# Patient Record
Sex: Male | Born: 2000 | Race: Black or African American | Hispanic: No | Marital: Single | Smoking: Never smoker
Health system: Southern US, Community
[De-identification: ages and names within clinical notes are randomized; demographics above are authoritative.]

---
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
## 2020-02-05 ENCOUNTER — Other Ambulatory Visit: Payer: Self-pay

## 2020-02-05 ENCOUNTER — Ambulatory Visit (HOSPITAL_COMMUNITY)
Admission: EM | Admit: 2020-02-05 | Discharge: 2020-02-05 | Disposition: A | Discharge status: Home / Self Care | Payer: Medicaid Other | Attending: Internal Medicine | Admitting: Internal Medicine

## 2020-02-05 DIAGNOSIS — Z1152 Encounter for screening for COVID-19: Secondary | ICD-10-CM | POA: Diagnosis not present

## 2020-02-05 DIAGNOSIS — Z20822 Contact with and (suspected) exposure to covid-19: Secondary | ICD-10-CM | POA: Insufficient documentation

## 2020-02-05 LAB — SARS CORONAVIRUS 2 (TAT 6-24 HRS): SARS Coronavirus 2: NEGATIVE

## 2020-02-05 NOTE — ED Triage Notes (Signed)
Pt presents for covid testing with no known symptoms. 

## 2020-02-05 NOTE — Discharge Instructions (Signed)
If your Covid-19 test is positive, you will get a phone call from [facility] regarding your results. If your Covid-19 test is negative, you will NOT get a phone call from [facility] with your results. You may view your results on MyChart. If you do not have a MyChart account, sign up instructions are in your discharge papers. ° °

## 2022-02-25 ENCOUNTER — Encounter (HOSPITAL_COMMUNITY): Payer: Self-pay

## 2022-02-25 ENCOUNTER — Ambulatory Visit (HOSPITAL_COMMUNITY): Payer: Self-pay

## 2022-02-25 ENCOUNTER — Ambulatory Visit (HOSPITAL_COMMUNITY)
Admission: EM | Admit: 2022-02-25 | Discharge: 2022-02-25 | Disposition: A | Discharge status: Home / Self Care | Payer: Medicaid Other | Attending: Family Medicine | Admitting: Family Medicine

## 2022-02-25 DIAGNOSIS — Z202 Contact with and (suspected) exposure to infections with a predominantly sexual mode of transmission: Secondary | ICD-10-CM

## 2022-02-25 DIAGNOSIS — Z113 Encounter for screening for infections with a predominantly sexual mode of transmission: Secondary | ICD-10-CM

## 2022-02-25 NOTE — ED Triage Notes (Signed)
Pt states he wants to be tested for STD denies any symptoms. 

## 2022-02-25 NOTE — Discharge Instructions (Addendum)
We will notify you of any positives on your swab

## 2022-02-25 NOTE — ED Provider Notes (Addendum)
[facility]    CSN: [id number] Arrival date & time: 02/25/22  1407      History   Chief Complaint Chief Complaint  Patient presents with   std testing    HPI Russell Russo is a 21 y.o. male.   HPI Here for STD testing. He has no penile discharge, itching, or dysuria.  He also has no fever or vomiting or abdominal pain.  He is not allergic to any medicines  He states he had HIV and RPR testing in July.  He has used barrier protection but not consistently in the past.  History reviewed. No pertinent past medical history.  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 No pertinent surgical history.     Home Medications    Prior to Admission medications   Medication Sig Start Date End Date Taking? Authorizing Provider  diphenhydrAMINE (BENADRYL) 25 mg capsule Take 1 capsule (25 mg total) by mouth every 6 (six) hours as needed for itching or allergies. 01/22/17 06/27/19  Blane Ohara, MD    Family History Family History  Problem Relation Age of Onset   Healthy Mother    Healthy Father     Social History Social History   Tobacco Use   Smoking status: Passive Smoke Exposure - Never Smoker   Smokeless tobacco: Never  Substance Use Topics   Alcohol use: Never   Drug use: Never     Allergies   Patient has no known allergies.   Review of Systems Review of Systems   Physical Exam Triage Vital Signs ED Triage Vitals [02/25/22 1438]  Enc Vitals Group     BP 108/72     Pulse Rate 74     Resp 16     Temp 98.6 F (37 C)     Temp Source Oral     SpO2 98 %     Weight      Height      Head Circumference      Peak Flow      Pain Score 0     Pain Loc      Pain Edu?      Excl. in [place]?    No data found.  Updated Vital Signs BP 108/72 (BP  Location: Right Arm)   Pulse 74   Temp 98.6 F (37 C) (Oral)   Resp 16   SpO2 98%   Visual Acuity Right Eye Distance:   Left Eye Distance:   Bilateral Distance:    Right Eye Near:   Left Eye Near:    Bilateral Near:     Physical Exam Vitals reviewed.  Constitutional:      General: He is not in acute distress.    Appearance: He is not ill-appearing, toxic-appearing or diaphoretic.  Cardiovascular:     Rate and Rhythm: Normal rate and regular rhythm.  Pulmonary:     Effort: Pulmonary effort is normal.     Breath sounds: Normal breath sounds.  Neurological:     Mental Status: He is alert and oriented to person, place, and time.  Psychiatric:        Behavior: Behavior normal.      UC Treatments / Results  Labs (all labs ordered are listed, but only abnormal results are displayed) Labs Reviewed  CYTOLOGY, (ORAL, ANAL, URETHRAL) ANCILLARY  ONLY    EKG   Radiology No results found.  Procedures Procedures (including critical care time)  Medications Ordered in UC Medications - No data to display  Initial Impression / Assessment and Plan / UC Course  I have reviewed the triage vital signs and the nursing notes.  Pertinent labs & imaging results that were available during my care of the patient were reviewed by me and considered in my medical decision making (see chart for details).        Self swab is done, and we will notify him of any positives and treat per protocol.  He is given education on safe sex practices. Final Clinical Impressions(s) / UC Diagnoses   Final diagnoses:  Screening for STD (sexually transmitted disease)     Discharge Instructions      We will notify you of any positives on your swab     ED Prescriptions   None    PDMP not reviewed this encounter.   Zenia Resides, MD 02/25/22 1514    Zenia Resides, MD 02/25/22 [phone]

## 2022-03-01 ENCOUNTER — Telehealth (HOSPITAL_COMMUNITY): Payer: Self-pay | Admitting: Emergency Medicine

## 2022-03-01 ENCOUNTER — Ambulatory Visit (HOSPITAL_COMMUNITY)
Admission: EM | Admit: 2022-03-01 | Discharge: 2022-03-01 | Disposition: A | Discharge status: Home / Self Care | Payer: Medicaid Other | Attending: Internal Medicine | Admitting: Internal Medicine

## 2022-03-01 DIAGNOSIS — A749 Chlamydial infection, unspecified: Secondary | ICD-10-CM | POA: Insufficient documentation

## 2022-03-01 NOTE — Telephone Encounter (Signed)
Patient will need to return for recollect of cyto swab, lab states foil was pierced.   Attempted to reach patient x 1, LVM

## 2022-03-01 NOTE — ED Triage Notes (Signed)
Pt here for recollect

## 2022-03-01 NOTE — Telephone Encounter (Signed)
Patient returned call, reviewed, he states he will return today

## 2022-03-02 LAB — CYTOLOGY, (ORAL, ANAL, URETHRAL) ANCILLARY ONLY
Chlamydia: POSITIVE — AB
Comment: NEGATIVE
Comment: NEGATIVE
Comment: NORMAL
Neisseria Gonorrhea: NEGATIVE
Trichomonas: NEGATIVE

## 2022-03-03 ENCOUNTER — Telehealth (HOSPITAL_COMMUNITY): Payer: Self-pay | Admitting: Emergency Medicine

## 2022-03-03 MED ORDER — DOXYCYCLINE HYCLATE 100 MG PO CAPS: 100.0000 mg | ORAL_CAPSULE | Freq: Two times a day (BID) | ORAL | 0 refills | Status: AC

## 2022-08-12 ENCOUNTER — Encounter (HOSPITAL_COMMUNITY): Payer: Self-pay

## 2022-08-12 ENCOUNTER — Ambulatory Visit (HOSPITAL_COMMUNITY)
Admission: RE | Admit: 2022-08-12 | Discharge: 2022-08-12 | Disposition: A | Discharge status: Home / Self Care | Payer: No Typology Code available for payment source | Source: Ambulatory Visit | Attending: Internal Medicine | Admitting: Internal Medicine

## 2022-08-12 VITALS — BP 116/76 | HR 69 | Temp 98.4°F | Resp 18

## 2022-08-12 DIAGNOSIS — Z202 Contact with and (suspected) exposure to infections with a predominantly sexual mode of transmission: Secondary | ICD-10-CM | POA: Insufficient documentation

## 2022-08-12 LAB — HIV ANTIBODY (ROUTINE TESTING W REFLEX): HIV Screen 4th Generation wRfx: NONREACTIVE

## 2022-08-12 MED ORDER — KETOROLAC TROMETHAMINE 30 MG/ML IJ SOLN
INTRAMUSCULAR | Status: AC
  Filled 2022-08-12: qty 1

## 2022-08-12 NOTE — Discharge Instructions (Signed)
Your STD testing has been sent to the lab and will come back in the next 2 to 3 days.  We will call you if any of your results are positive requiring treatment and treat you at that time.   If you do not receive a phone call from [place], this means your testing was negative.  Avoid sexual intercourse until your STD results come back.  If any of your STD results are positive, you will need to avoid sexual intercourse for 7 days while you are being treated to prevent spread of STD.  Condom use is the best way to prevent spread of STDs.  Return to urgent care as needed.

## 2022-08-12 NOTE — ED Provider Notes (Signed)
[facility]    CSN: [id number] Arrival date & time: 08/12/22  1033      History   Chief Complaint Chief Complaint  Patient presents with   SEXUALLY TRANSMITTED DISEASE    I don't have any symptoms but I just want to get tested - Entered by patient   Appointment    HPI Russell Russo is a 22 y.o. male.   Patient presents to urgent care for routine STD screening.  He reports recent new sexual partner and would like to be screened for all STDs.  He is sexually active with male partner and reports no recent sexual partner.  No known exposure to STD.  Denies history of HIV or syphilis.  No history of HSV-2.  Currently asymptomatic without rectal or penile rash, penile discharge, and penile itching.  No abdominal pain, nausea, vomiting, flank pain, dysuria, other urinary symptoms, or fever/chills.  No recent antibiotic or steroid use.     History reviewed. No pertinent past medical history.  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 No pertinent surgical history.     Home Medications    Prior to Admission medications   Medication Sig Start Date End Date Taking? Authorizing Provider  diphenhydrAMINE (BENADRYL) 25 mg capsule Take 1 capsule (25 mg total) by mouth every 6 (six) hours as needed for itching or allergies. 01/22/17 06/27/19  Elnora Morrison, MD    Family History Family History  Problem Relation Age of Onset   Healthy Mother    Healthy Father     Social History Social History   Tobacco Use   Smoking status: Never    Passive exposure: Yes   Smokeless tobacco: Never  Vaping Use   Vaping Use: Never used  Substance Use Topics   Alcohol use: Never   Drug use: Never     Allergies   Patient has no known allergies.   Review of Systems Review of  Systems Per HPI  Physical Exam Triage Vital Signs ED Triage Vitals [08/12/22 1052]  Enc Vitals Group     BP 116/76     Pulse Rate 69     Resp 18     Temp 98.4 F (36.9 C)     Temp Source Oral     SpO2 98 %     Weight      Height      Head Circumference      Peak Flow      Pain Score 0     Pain Loc      Pain Edu?      Excl. in [place]?    No data found.  Updated Vital Signs BP 116/76 (BP Location: Right Arm)   Pulse 69   Temp 98.4 F (36.9 C) (Oral)   Resp 18   SpO2 98%   Visual Acuity Right Eye Distance:   Left Eye Distance:   Bilateral Distance:    Right Eye Near:   Left Eye Near:    Bilateral Near:     Physical Exam Vitals and nursing note reviewed.  Constitutional:      Appearance: He is not ill-appearing or toxic-appearing.  HENT:     Head: Normocephalic and atraumatic.     Right Ear: Hearing and external ear normal.     Left Ear: Hearing and  external ear normal.     Nose: Nose normal.     Mouth/Throat:     Lips: Pink.  Eyes:     General: Lids are normal. Vision grossly intact. Gaze aligned appropriately.     Extraocular Movements: Extraocular movements intact.     Conjunctiva/sclera: Conjunctivae normal.  Cardiovascular:     Rate and Rhythm: Normal rate and regular rhythm.     Heart sounds: Normal heart sounds, S1 normal and S2 normal.  Pulmonary:     Effort: Pulmonary effort is normal. No respiratory distress.     Breath sounds: Normal breath sounds and air entry.  Musculoskeletal:     Cervical back: Neck supple.  Skin:    General: Skin is warm and dry.     Capillary Refill: Capillary refill takes less than 2 seconds.     Findings: No rash.  Neurological:     General: No focal deficit present.     Mental Status: He is alert and oriented to person, place, and time. Mental status is at baseline.     Cranial Nerves: No dysarthria or facial asymmetry.  Psychiatric:        Mood and Affect: Mood normal.        Speech: Speech normal.         Behavior: Behavior normal.        Thought Content: Thought content normal.        Judgment: Judgment normal.      UC Treatments / Results  Labs (all labs ordered are listed, but only abnormal results are displayed) Labs Reviewed  HIV ANTIBODY (ROUTINE TESTING W REFLEX)  RPR  CYTOLOGY, (ORAL, ANAL, URETHRAL) ANCILLARY ONLY    EKG   Radiology No results found.  Procedures Procedures (including critical care time)  Medications Ordered in UC Medications - No data to display  Initial Impression / Assessment and Plan / UC Course  I have reviewed the triage vital signs and the nursing notes.  Pertinent labs & imaging results that were available during my care of the patient were reviewed by me and considered in my medical decision making (see chart for details).   1. Possible exposure to STD STI labs pending.  Patient would like HIV and syphilis testing today.  Will notify patient of positive results and treat accordingly when labs come back.  Patient to avoid sexual intercourse until screening testing comes back.  Education provided regarding safe sexual practices and patient encouraged to use protection to prevent spread of STIs.   Discussed physical exam and available lab work findings in clinic with patient.  Counseled patient regarding appropriate use of medications and potential side effects for all medications recommended or prescribed today. Discussed red flag signs and symptoms of worsening condition,when to call the PCP office, return to urgent care, and when to seek higher level of care in the emergency department. Patient verbalizes understanding and agreement with plan. All questions answered. Patient discharged in stable condition.    Final Clinical Impressions(s) / UC Diagnoses   Final diagnoses:  Possible exposure to STD     Discharge Instructions      Your STD testing has been sent to the lab and will come back in the next 2 to 3 days.  We will call you if  any of your results are positive requiring treatment and treat you at that time.   If you do not receive a phone call from [place], this means your testing was negative.  Avoid sexual intercourse  until your STD results come back.  If any of your STD results are positive, you will need to avoid sexual intercourse for 7 days while you are being treated to prevent spread of STD.  Condom use is the best way to prevent spread of STDs.  Return to urgent care as needed.     ED Prescriptions   None    PDMP not reviewed this encounter.   Talbot Grumbling, [place] 08/12/22 1114

## 2022-08-12 NOTE — ED Triage Notes (Signed)
Pt state he would like STI testing he denies any sx but states he has unprotected sex.

## 2022-08-13 LAB — RPR: RPR Ser Ql: NONREACTIVE

## 2022-08-15 LAB — CYTOLOGY, (ORAL, ANAL, URETHRAL) ANCILLARY ONLY
Chlamydia: NEGATIVE
Comment: NEGATIVE
Comment: NEGATIVE
Comment: NORMAL
Neisseria Gonorrhea: NEGATIVE
Trichomonas: NEGATIVE

## 2022-11-02 ENCOUNTER — Ambulatory Visit: Payer: Self-pay

## 2022-11-03 ENCOUNTER — Ambulatory Visit: Payer: Self-pay

## 2023-01-01 ENCOUNTER — Ambulatory Visit (HOSPITAL_COMMUNITY): Payer: Self-pay

## 2023-02-06 ENCOUNTER — Ambulatory Visit (HOSPITAL_COMMUNITY): Payer: Self-pay

## 2023-02-07 ENCOUNTER — Ambulatory Visit (HOSPITAL_COMMUNITY)
Admission: RE | Admit: 2023-02-07 | Discharge: 2023-02-07 | Disposition: A | Discharge status: Home / Self Care | Payer: No Typology Code available for payment source | Source: Ambulatory Visit | Attending: Family Medicine | Admitting: Family Medicine

## 2023-02-07 ENCOUNTER — Encounter (HOSPITAL_COMMUNITY): Payer: Self-pay

## 2023-02-07 VITALS — BP 108/73 | HR 104 | Temp 98.6°F | Resp 13

## 2023-02-07 DIAGNOSIS — Z202 Contact with and (suspected) exposure to infections with a predominantly sexual mode of transmission: Secondary | ICD-10-CM | POA: Diagnosis not present

## 2023-02-07 DIAGNOSIS — K1370 Unspecified lesions of oral mucosa: Secondary | ICD-10-CM | POA: Diagnosis not present

## 2023-02-07 LAB — HIV ANTIBODY (ROUTINE TESTING W REFLEX): HIV Screen 4th Generation wRfx: NONREACTIVE

## 2023-02-07 NOTE — ED Triage Notes (Signed)
Pt requesting herpes testing. Reports had bump come up on his lip in past several days as to concern. Denies bumps, sores, lesions anywhere else.

## 2023-02-07 NOTE — ED Provider Notes (Signed)
[facility]    CSN: [id number] Arrival date & time: 02/07/23  1722      History   Chief Complaint Chief Complaint  Patient presents with   Appointment   Mouth Lesions    HPI Russell Russo is a 22 y.o. male.    Mouth Lesions Here for a bump on his upper lip.  It has been there a few days and is actually starting to get better.  He never saw anything that looked like an unroofed blister or ulceration.  He is concerned about herpes and would like to be tested  He also would like to have urethral swab done for STD testing and he would like to have blood work for HIV and syphilis  History reviewed. No pertinent past medical history.  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 No pertinent surgical history.     Home Medications    Prior to Admission medications   Medication Sig Start Date End Date Taking? Authorizing Provider  diphenhydrAMINE (BENADRYL) 25 mg capsule Take 1 capsule (25 mg total) by mouth every 6 (six) hours as needed for itching or allergies. 01/22/17 06/27/19  Blane Ohara, MD    Family History Family History  Problem Relation Age of Onset   Healthy Mother    Healthy Father     Social History Social History   Tobacco Use   Smoking status: Never    Passive exposure: Yes   Smokeless tobacco: Never  Vaping Use   Vaping status: Never Used  Substance Use Topics   Alcohol use: Never   Drug use: Never     Allergies   Patient has no known allergies.   Review of Systems Review of Systems  HENT:  Positive for mouth sores.      Physical Exam Triage Vital Signs ED Triage Vitals [02/07/23 1750]  Encounter Vitals Group     BP 108/73     Systolic BP Percentile      Diastolic BP Percentile      Pulse Rate (!) 104     Resp 13     Temp  98.6 F (37 C)     Temp Source Oral     SpO2 98 %     Weight      Height      Head Circumference      Peak Flow      Pain Score 0     Pain Loc      Pain Education      Exclude from Growth Chart    No data found.  Updated Vital Signs BP 108/73 (BP Location: Left Arm)   Pulse (!) 104   Temp 98.6 F (37 C) (Oral)   Resp 13   SpO2 98%   Visual Acuity Right Eye Distance:   Left Eye Distance:   Bilateral Distance:    Right Eye Near:   Left Eye Near:    Bilateral Near:     Physical Exam Vitals reviewed.  Constitutional:      General: He is not in acute distress.    Appearance: He is not ill-appearing, toxic-appearing or diaphoretic.  HENT:     Mouth/Throat:     Comments: There is a tiny bump at his vermilion border on the upper lip just to the left of the  philtrum.  There is no ulceration. Skin:    Coloration: Skin is not pale.  Neurological:     Mental Status: He is alert and oriented to person, place, and time.  Psychiatric:        Behavior: Behavior normal.      UC Treatments / Results  Labs (all labs ordered are listed, but only abnormal results are displayed) Labs Reviewed  HSV CULTURE AND TYPING  RPR  HIV ANTIBODY (ROUTINE TESTING W REFLEX)  CYTOLOGY, (ORAL, ANAL, URETHRAL) ANCILLARY ONLY    EKG   Radiology No results found.  Procedures Procedures (including critical care time)  Medications Ordered in UC Medications - No data to display  Initial Impression / Assessment and Plan / UC Course  I have reviewed the triage vital signs and the nursing notes.  Pertinent labs & imaging results that were available during my care of the patient were reviewed by me and considered in my medical decision making (see chart for details).        Viral swab was done of the bump on his lip.  I discussed with him that I think it is a low likelihood that this was herpes the way it looks today and since he never saw a true ulceration or vesicular  lesion.  Also I will not treat with Valtrex since this is improving and is already been past the 72-hour mark since it started bothering him  Lab was drawn to check HIV and RPR. Urethral self swab is done and staff will notify him of any positives on the swab or blood work and treat per protocol.    Final Clinical Impressions(s) / UC Diagnoses   Final diagnoses:  Oral lesion  Exposure to STD     Discharge Instructions      We have done a swab to check for herpes.  We have drawn blood to check HIV and syphilis tests  Staff will notify you if there is anything positive on the swabs or blood work      ED Prescriptions   None    PDMP not reviewed this encounter.   Zenia Resides, MD 02/07/23 [facility]

## 2023-02-07 NOTE — Discharge Instructions (Signed)
We have done a swab to check for herpes.  We have drawn blood to check HIV and syphilis tests  Staff will notify you if there is anything positive on the swabs or blood work

## 2023-02-08 LAB — CYTOLOGY, (ORAL, ANAL, URETHRAL) ANCILLARY ONLY
Chlamydia: NEGATIVE
Comment: NEGATIVE
Comment: NEGATIVE
Comment: NORMAL
Neisseria Gonorrhea: NEGATIVE
Trichomonas: NEGATIVE

## 2023-02-08 LAB — RPR: RPR Ser Ql: NONREACTIVE

## 2023-02-11 LAB — HSV CULTURE AND TYPING

## 2023-03-15 ENCOUNTER — Encounter (HOSPITAL_COMMUNITY): Payer: Self-pay

## 2023-03-15 ENCOUNTER — Ambulatory Visit (HOSPITAL_COMMUNITY)
Admission: RE | Admit: 2023-03-15 | Discharge: 2023-03-15 | Disposition: A | Discharge status: Home / Self Care | Payer: No Typology Code available for payment source | Source: Ambulatory Visit | Attending: Family Medicine | Admitting: Family Medicine

## 2023-03-15 VITALS — BP 106/64 | HR 68 | Temp 97.6°F | Resp 16 | Ht 67.0 in | Wt 106.0 lb

## 2023-03-15 DIAGNOSIS — Z113 Encounter for screening for infections with a predominantly sexual mode of transmission: Secondary | ICD-10-CM

## 2023-03-15 LAB — HIV ANTIBODY (ROUTINE TESTING W REFLEX): HIV Screen 4th Generation wRfx: NONREACTIVE

## 2023-03-15 NOTE — ED Provider Notes (Signed)
  [facility]   [id number] 03/15/23 Arrival Time: 1546  ASSESSMENT & PLAN:  1. Screening for STDs (sexually transmitted diseases)       Discharge Instructions      We have sent testing for sexually transmitted infections. We will notify you of any positive results once they are received. If required, we will prescribe any medications you might need.  Please refrain from all sexual activity for at least the next seven days.     Pending: Labs Reviewed  HIV ANTIBODY (ROUTINE TESTING W REFLEX)  RPR  CYTOLOGY, (ORAL, ANAL, URETHRAL) ANCILLARY ONLY   Will notify of any positive results. Instructed to refrain from sexual activity for at least seven days.  Reviewed expectations re: course of current medical issues. Questions answered. Outlined signs and symptoms indicating need for more acute intervention. Patient verbalized understanding. After Visit Summary given.   SUBJECTIVE:  Russell Russo is a 22 y.o. male who requests STD testing. No symptoms.  OBJECTIVE:  Vitals:   03/15/23 1619  BP: 106/64  Pulse: 68  Resp: 16  Temp: 97.6 F (36.4 C)  TempSrc: Oral  SpO2: 98%  Weight: 48.1 kg  Height: 5\' 7"  (1.702 m)    General appearance: alert, cooperative, appears stated age and no distress Throat: lips, mucosa, and tongue normal; teeth and gums normal Lungs: unlabored respirations; speaks full sentences without difficulty Back: no CVA tenderness; FROM at waist Abdomen: soft, non-tender GU: deferred Skin: warm and dry Psychological: alert and cooperative; normal mood and affect.   Labs Reviewed  HIV ANTIBODY (ROUTINE TESTING W REFLEX)  RPR  CYTOLOGY, (ORAL, ANAL, URETHRAL) ANCILLARY ONLY    No Known Allergies  History reviewed. No pertinent past medical history. Family History  Problem Relation Age of Onset   Healthy Mother    Healthy Father    Social History   Socioeconomic History   Marital status: Single    Spouse name: Not on file    Number of children: Not on file   Years of education: Not on file   Highest education level: Not on file  Occupational History   Not on file  Tobacco Use   Smoking status: Never    Passive exposure: Yes   Smokeless tobacco: Never  Vaping Use   Vaping status: Never Used  Substance and Sexual Activity   Alcohol use: Yes    Comment: occasionally   Drug use: Never   Sexual activity: Yes    Birth control/protection: Condom  Other Topics Concern   Not on file  Social History Narrative   Is in 11th grade at [employer]   Social Determinants of Health   Financial Resource Strain: Not on file  Food Insecurity: Not on file  Transportation Needs: Not on file  Physical Activity: Not on file  Stress: Not on file  Social Connections: Not on file  Intimate Partner Violence: Not on file           [place], MD 03/15/23 1701

## 2023-03-15 NOTE — Discharge Instructions (Signed)
We have sent testing for sexually transmitted infections. We will notify you of any positive results once they are received. If required, we will prescribe any medications you might need.  Please refrain from all sexual activity for at least the next seven days.

## 2023-03-15 NOTE — ED Triage Notes (Signed)
Patient here today to be tested for all STDs.

## 2023-03-16 LAB — RPR: RPR Ser Ql: NONREACTIVE

## 2023-03-20 LAB — CYTOLOGY, (ORAL, ANAL, URETHRAL) ANCILLARY ONLY
Chlamydia: NEGATIVE
Comment: NEGATIVE
Comment: NEGATIVE
Comment: NORMAL
Neisseria Gonorrhea: NEGATIVE
Trichomonas: NEGATIVE
